# Patient Record
Sex: Female | Born: 2005 | Race: Black or African American | Hispanic: No | Marital: Single | Smoking: Former smoker
Health system: Southern US, Community
[De-identification: ages and names within clinical notes are randomized; demographics above are authoritative.]

---
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
## 2023-01-10 ENCOUNTER — Encounter: Payer: Medicaid Other | Admitting: Nurse Practitioner

## 2023-01-10 DIAGNOSIS — Z113 Encounter for screening for infections with a predominantly sexual mode of transmission: Secondary | ICD-10-CM | POA: Diagnosis not present

## 2023-01-11 ENCOUNTER — Encounter: Payer: Medicaid Other | Admitting: Nurse Practitioner

## 2023-04-24 ENCOUNTER — Telehealth: Payer: Self-pay

## 2023-04-24 NOTE — Telephone Encounter (Signed)
LVM for patient to call back [phone], or to call PCP office to schedule follow up apt. AS, CMA  

## 2023-05-27 DIAGNOSIS — K047 Periapical abscess without sinus: Secondary | ICD-10-CM | POA: Diagnosis not present

## 2024-03-07 DIAGNOSIS — X58XXXA Exposure to other specified factors, initial encounter: Secondary | ICD-10-CM | POA: Diagnosis not present

## 2024-03-07 DIAGNOSIS — S62662A Nondisplaced fracture of distal phalanx of right middle finger, initial encounter for closed fracture: Secondary | ICD-10-CM | POA: Diagnosis not present

## 2024-03-07 DIAGNOSIS — S63612A Unspecified sprain of right middle finger, initial encounter: Secondary | ICD-10-CM | POA: Diagnosis not present

## 2024-03-07 DIAGNOSIS — M79644 Pain in right finger(s): Secondary | ICD-10-CM | POA: Diagnosis not present

## 2024-04-06 ENCOUNTER — Emergency Department
Admission: EM | Admit: 2024-04-06 | Discharge: 2024-04-06 | Disposition: A | Discharge status: Home / Self Care | Attending: Emergency Medicine | Admitting: Emergency Medicine

## 2024-04-06 ENCOUNTER — Emergency Department

## 2024-04-06 ENCOUNTER — Other Ambulatory Visit: Payer: Self-pay

## 2024-04-06 DIAGNOSIS — R55 Syncope and collapse: Secondary | ICD-10-CM | POA: Insufficient documentation

## 2024-04-06 DIAGNOSIS — M791 Myalgia, unspecified site: Secondary | ICD-10-CM | POA: Diagnosis not present

## 2024-04-06 DIAGNOSIS — N3001 Acute cystitis with hematuria: Secondary | ICD-10-CM | POA: Diagnosis not present

## 2024-04-06 DIAGNOSIS — R42 Dizziness and giddiness: Secondary | ICD-10-CM | POA: Diagnosis not present

## 2024-04-06 LAB — CBC WITH DIFFERENTIAL/PLATELET
Abs Immature Granulocytes: 0.06 10*3/uL (ref 0.00–0.07)
Basophils Absolute: 0.1 10*3/uL (ref 0.0–0.1)
Basophils Relative: 1 %
Eosinophils Absolute: 0.1 10*3/uL (ref 0.0–0.5)
Eosinophils Relative: 1 %
HCT: 36.5 % (ref 36.0–46.0)
Hemoglobin: 12.2 g/dL (ref 12.0–15.0)
Immature Granulocytes: 0 %
Lymphocytes Relative: 16 %
Lymphs Abs: 2.2 10*3/uL (ref 0.7–4.0)
MCH: 28.8 pg (ref 26.0–34.0)
MCHC: 33.4 g/dL (ref 30.0–36.0)
MCV: 86.1 fL (ref 80.0–100.0)
Monocytes Absolute: 0.7 10*3/uL (ref 0.1–1.0)
Monocytes Relative: 5 %
Neutro Abs: 10.2 10*3/uL — ABNORMAL HIGH (ref 1.7–7.7)
Neutrophils Relative %: 77 %
Platelets: 230 10*3/uL (ref 150–400)
RBC: 4.24 MIL/uL (ref 3.87–5.11)
RDW: 13.2 % (ref 11.5–15.5)
WBC: 13.4 10*3/uL — ABNORMAL HIGH (ref 4.0–10.5)
nRBC: 0 % (ref 0.0–0.2)

## 2024-04-06 LAB — URINALYSIS, ROUTINE W REFLEX MICROSCOPIC
Bilirubin Urine: NEGATIVE
Glucose, UA: NEGATIVE mg/dL
Ketones, ur: 5 mg/dL — AB
Leukocytes,Ua: NEGATIVE
Nitrite: POSITIVE — AB
Protein, ur: NEGATIVE mg/dL
Specific Gravity, Urine: 1.024 (ref 1.005–1.030)
pH: 5 (ref 5.0–8.0)

## 2024-04-06 LAB — COMPREHENSIVE METABOLIC PANEL WITH GFR
ALT: 15 U/L (ref 0–44)
AST: 23 U/L (ref 15–41)
Albumin: 4.6 g/dL (ref 3.5–5.0)
Alkaline Phosphatase: 60 U/L (ref 38–126)
Anion gap: 2 — ABNORMAL LOW (ref 5–15)
BUN: 13 mg/dL (ref 6–20)
CO2: 31 mmol/L (ref 22–32)
Calcium: 9.2 mg/dL (ref 8.9–10.3)
Chloride: 106 mmol/L (ref 98–111)
Creatinine, Ser: 0.81 mg/dL (ref 0.44–1.00)
GFR, Estimated: >60 mL/min (ref >60)
Glucose, Bld: 144 mg/dL — ABNORMAL HIGH (ref 70–99)
Potassium: 3.1 mmol/L — ABNORMAL LOW (ref 3.5–5.1)
Sodium: 139 mmol/L (ref 135–145)
Total Bilirubin: 0.7 mg/dL (ref 0.0–1.2)
Total Protein: 7.5 g/dL (ref 6.5–8.1)

## 2024-04-06 LAB — TROPONIN I (HIGH SENSITIVITY): Troponin I (High Sensitivity): 7 ng/L (ref <18)

## 2024-04-06 LAB — POC URINE PREG, ED: Preg Test, Ur: NEGATIVE

## 2024-04-06 MED ORDER — CEPHALEXIN 500 MG PO CAPS
500.0000 mg | ORAL_CAPSULE | Freq: Three times a day (TID) | ORAL | 0 refills | Status: AC
Start: 2024-04-06 — End: 2024-04-09

## 2024-04-06 MED ORDER — NAPROXEN 500 MG PO TABS
500.0000 mg | ORAL_TABLET | Freq: Once | ORAL | Status: AC
  Administered 2024-04-06: 500 mg via ORAL
  Filled 2024-04-06: qty 1

## 2024-04-06 NOTE — ED Notes (Signed)
 Mother to nurses station reporting that pt is c/o of bilateral shoulder pain. Rn Odilia Bennett made aware, advised that she will message the NP and let her know. Mom reports pt asked for pain medication "an hour ago and got the same answer" and asked "is the doctor not in the building?" Mother was informed the NP is in the building.

## 2024-04-06 NOTE — ED Triage Notes (Signed)
 Pt brought in from work by EMS for lightheadedness and weakness that started today. Per pt, she passed out at work and hit her head. EMS made no mention of syncopal episode in their report. Pt complaining of chest pain, back pain, and shoulder pain; rates pain 10/10.

## 2024-04-09 NOTE — ED Provider Notes (Signed)
 [facility] Provider Note    Event Date/Time   First MD Initiated Contact with Patient 04/06/24 1135     (approximate)   History   Loss of Consciousness   HPI  Chelsea Galloway is a 18 y.o. female with no past medical history and as listed in EMR presents to the emergency department for treatment and evaluation after feeling lightheaded and weak today while at work.  She arrives via EMS and now reports that she had a syncopal episode that was witnessed by her Production designer, theatre/television/film.  Manager states that she was out for a few seconds but she did hit her head.  Patient complaining of headache, bilateral shoulder pain and chest pain.Aaron Aas      Physical Exam   Triage Vital Signs: ED Triage Vitals  Encounter Vitals Group     BP 04/06/24 1141 134/83     Systolic BP Percentile --      Diastolic BP Percentile --      Pulse Rate 04/06/24 1141 74     Resp 04/06/24 1141 20     Temp 04/06/24 1141 97.6 F (36.4 C)     Temp Source 04/06/24 1141 Oral     SpO2 04/06/24 1141 100 %     Weight 04/06/24 1142 100 lb (45.4 kg)     Height 04/06/24 1142 5\' 5"  (1.651 m)     Head Circumference --      Peak Flow --      Pain Score 04/06/24 1142 10     Pain Loc --      Pain Education --      Exclude from Growth Chart --     Most recent vital signs: Vitals:   04/06/24 1430 04/06/24 1500  BP: 123/69 118/71  Pulse: 71 71  Resp: (!) 23 15  Temp:    SpO2: 100% 100%    General: Awake, no distress.  CV:  Good peripheral perfusion.  Resp:  Normal effort.  Abd:  No distention.  Other:  No focal neuro abnormality. Pupils equal, round, and reactive to light. Full ROM bilateral shoulders. No focal tenderness along length of the spine. Ambulates without assistance and normal gait.   ED Results / Procedures / Treatments   Labs (all labs ordered are listed, but only abnormal results are displayed) Labs Reviewed  URINALYSIS, ROUTINE W REFLEX MICROSCOPIC - Abnormal; Notable for the following  components:      Result Value   Color, Urine AMBER (*)    APPearance TURBID (*)    Hgb urine dipstick MODERATE (*)    Ketones, ur 5 (*)    Nitrite POSITIVE (*)    Bacteria, UA MANY (*)    All other components within normal limits  CBC WITH DIFFERENTIAL/PLATELET - Abnormal; Notable for the following components:   WBC 13.4 (*)    Neutro Abs 10.2 (*)    All other components within normal limits  COMPREHENSIVE METABOLIC PANEL WITH GFR - Abnormal; Notable for the following components:   Potassium 3.1 (*)    Glucose, Bld 144 (*)    Anion gap 2 (*)    All other components within normal limits  POC URINE PREG, ED  TROPONIN I (HIGH SENSITIVITY)     EKG  NSR rate of 75   RADIOLOGY  Image and radiology report reviewed and interpreted by me. Radiology report consistent with the same.  CT head and cervical spine negative for acute concerns.  PROCEDURES:  Critical Care performed: No  Procedures   MEDICATIONS ORDERED IN ED:  Medications  naproxen  (NAPROSYN ) tablet 500 mg (500 mg Oral Given 04/06/24 1341)     IMPRESSION / MDM / ASSESSMENT AND PLAN / ED COURSE   I have reviewed the triage note.  Differential diagnosis includes, but is not limited to, syncope, hypoglycemia, cardiac event  Patient's presentation is most consistent with acute presentation with potential threat to life or bodily function.  18 year old female presenting to the emergency department after syncopal episode while at work today.  Exam is reassuring.  She has no focal neuro deficits.  She has general body pain without difficulty with range of motion. Plan will be to await labs and get CT imaging of her head and cervical spine.   EKG, CT head and cervical spine and labs are reassuring.  Urinalysis does show many bacteria, positive nitrates and moderate hemoglobin.  Patient discharged home with instruction to take medication as prescribed until finished.  She also reports that she had not eaten today  and was encouraged to eat before she goes to work.  She is to follow-up with primary care if symptoms become recurrent.      FINAL CLINICAL IMPRESSION(S) / ED DIAGNOSES   Final diagnoses:  Syncope, unspecified syncope type  Acute cystitis with hematuria     Rx / DC Orders   ED Discharge Orders          Ordered    cephALEXin  (KEFLEX ) 500 MG capsule  3 times daily        04/06/24 1458             Note:  This document was prepared using Dragon voice recognition software and may include unintentional dictation errors.   Sherryle Don, FNP 04/09/24 1445    Viviano Ground, MD 04/14/24 1028

## 2024-05-26 ENCOUNTER — Ambulatory Visit: Payer: Self-pay

## 2024-05-26 NOTE — Telephone Encounter (Signed)
 FYI Only or Action Required?: FYI only for provider.  Patient was last seen in primary care on 09/09/2021 by Melvin Pao, NP.  Called Nurse Triage reporting Loss of Consciousness and Nausea.  Symptoms began several days ago.  Interventions attempted: Rest, hydration, or home remedies.  Symptoms are: unchanged.  Triage Disposition: See PCP When Office is Open (Within 3 Days)  Patient/caregiver understands and will follow disposition?: Yes      Copied from CRM [id number]. Topic: Clinical - Red Word Triage >> May 26, 2024  9:02 AM Chelsea Galloway wrote: Red Word that prompted transfer to Nurse Triage: Passing out and experiencing nausea. First episode was not sure of date. EMT called but did not go to emergency room (Blood pressure or blood sugar was low enough to admitted in hospital). Second episode was on July 19th, 2025 felt herself about to pass out but she didn't, but had the same exact symptoms. Reason for Disposition  [1] All other patients AND [2] now alert and feels fine  (Exception: SIMPLE FAINT due to stress, pain, prolonged standing, or suddenly standing)  Answer Assessment - Initial Assessment Questions 1. ONSET: How long were you unconscious? (e.g., minutes, seconds) When did it happen?     Unknown, thinks a few minutes 2. CONTENT: What happened during the period of unconsciousness? (e.g., seizure activity)      Before events - Hot, loses vision and then passes out 3. MENTAL STATUS: Alert and oriented now? (e.g., oriented x 3 = name, month, location)      yes 4. TRIGGER: What do you think caused the fainting? What were you doing just before you fainted?  (e.g., exercise, sudden standing up, prolonged standing)     Endorses possible low blood sugar during 1st event 5. RECURRENT SYMPTOM: Have you ever passed out before? If Yes, ask: When was the last time? and What happened that time?      Yes, 1 event prior and was taken to ED: endorses had either low BP,  or low sugar and was dx with UTI (completed tx) 6. INJURY: Did you hurt yourself when you fell?      Hit back of head 7. CARDIAC SYMPTOMS: Have you had any of the following symptoms: chest pain, difficulty breathing, palpitations?     Heart was racing pretty  fast when it happened 8. NEUROLOGIC SYMPTOMS: Have you had any of the following symptoms: headache, numbness, vertigo, weakness?     Does endorse some weakness, denies other sx 9. GI SYMPTOMS: Have you had any of the following symptoms: abdomen pain, vomiting, diarrhea, blood in stools?     nausea 10. OTHER SYMPTOMS: Do you have any other symptoms?       denies 11. PREGNANCY: Is there any chance you are pregnant? When was your last menstrual period?       Denies LMP currently on cycle now    Scheduled patient on the next available acute appt on May 28, 2024 with PCP .  Protocols used: [employer]

## 2024-05-26 NOTE — Telephone Encounter (Signed)
 Noted

## 2024-05-28 ENCOUNTER — Ambulatory Visit: Attending: Nurse Practitioner

## 2024-05-28 ENCOUNTER — Encounter: Payer: Self-pay | Admitting: Nurse Practitioner

## 2024-05-28 ENCOUNTER — Ambulatory Visit (INDEPENDENT_AMBULATORY_CARE_PROVIDER_SITE_OTHER): Admitting: Nurse Practitioner

## 2024-05-28 VITALS — BP 100/64 | HR 93 | Temp 99.0°F | Ht 65.0 in | Wt 95.4 lb

## 2024-05-28 DIAGNOSIS — R55 Syncope and collapse: Secondary | ICD-10-CM | POA: Diagnosis not present

## 2024-05-28 DIAGNOSIS — R7309 Other abnormal glucose: Secondary | ICD-10-CM

## 2024-05-28 NOTE — Progress Notes (Signed)
 BP 100/64   Pulse 93   Temp 99 F (37.2 C) (Oral)   Ht 5' 5 (1.651 m)   Wt 95 lb 6.4 oz (43.3 kg)   LMP 05/26/2024 (Exact Date)   BMI 15.88 kg/m    Subjective:    Patient ID: Chelsea Galloway, female    DOB: 04-10-2006, 18 y.o.   MRN: [id number]  HPI: Chelsea Galloway is a 18 y.o. female  Chief Complaint  Patient presents with   Loss of Consciousness    Patient states she has passed out twice since 2024-04-24.    Patient states she was at work on Jun 19, 2025and she wasn't feeling well and then she passed out.  This happened another time on June 19.  She didn't pass out but she felt like she was going to.  She felt like her vision was blurry, she was sweating, breathing was faster.  States she calmed herself down and then EMS was called and they did vitals and her blood sugar.  She states she drinks mainly Gatorade.  Drinks a good amount of soda also.  Since those episodes she has been drinking more water.  States she does eat regularly.  She does smoke marijuana.  Doesn't drink alcohol on a regular basis.  No tobacco use.     Relevant past medical, surgical, family and social history reviewed and updated as indicated. Interim medical history since our last visit reviewed. Allergies and medications reviewed and updated.  Review of Systems  Neurological:  Positive for syncope.    Per HPI unless specifically indicated above     Objective:    BP 100/64   Pulse 93   Temp 99 F (37.2 C) (Oral)   Ht 5' 5 (1.651 m)   Wt 95 lb 6.4 oz (43.3 kg)   LMP 05/26/2024 (Exact Date)   BMI 15.88 kg/m   Wt Readings from Last 3 Encounters:  05/28/24 95 lb 6.4 oz (43.3 kg) (1%, Z= -2.25)*  2024-04-24 100 lb (45.4 kg) (3%, Z= -1.82)*  09/09/21 103 lb 12.8 oz (47.1 kg) (12%, Z= -1.18)*   * Growth percentiles are based on CDC (Girls, 2-20 Years) data.    Physical Exam Vitals and nursing note reviewed.  Constitutional:      General: She is not in acute distress.    Appearance: Normal appearance.  She is normal weight. She is not ill-appearing, toxic-appearing or diaphoretic.  HENT:     Head: Normocephalic.     Right Ear: External ear normal.     Left Ear: External ear normal.     Nose: Nose normal.     Mouth/Throat:     Mouth: Mucous membranes are moist.     Pharynx: Oropharynx is clear.  Eyes:     General:        Right eye: No discharge.        Left eye: No discharge.     Extraocular Movements: Extraocular movements intact.     Conjunctiva/sclera: Conjunctivae normal.     Pupils: Pupils are equal, round, and reactive to light.  Cardiovascular:     Rate and Rhythm: Normal rate and regular rhythm.     Heart sounds: No murmur heard. Pulmonary:     Effort: Pulmonary effort is normal. No respiratory distress.     Breath sounds: Normal breath sounds. No wheezing or rales.  Musculoskeletal:     Cervical back: Normal range of motion and neck supple.  Skin:    General: Skin  is warm and dry.     Capillary Refill: Capillary refill takes less than 2 seconds.  Neurological:     General: No focal deficit present.     Mental Status: She is alert and oriented to person, place, and time. Mental status is at baseline.  Psychiatric:        Mood and Affect: Mood normal.        Behavior: Behavior normal.        Thought Content: Thought content normal.        Judgment: Judgment normal.     Results for orders placed or performed during the hospital encounter of 04/06/24  Urinalysis, Routine w reflex microscopic -Urine, Clean Catch   Collection Time: 04/06/24 12:29 PM  Result Value Ref Range   Color, Urine AMBER (A) YELLOW   APPearance TURBID (A) CLEAR   Specific Gravity, Urine 1.024 1.005 - 1.030   pH 5.0 5.0 - 8.0   Glucose, UA NEGATIVE NEGATIVE mg/dL   Hgb urine dipstick MODERATE (A) NEGATIVE   Bilirubin Urine NEGATIVE NEGATIVE   Ketones, ur 5 (A) NEGATIVE mg/dL   Protein, ur NEGATIVE NEGATIVE mg/dL   Nitrite POSITIVE (A) NEGATIVE   Leukocytes,Ua NEGATIVE NEGATIVE   RBC / HPF  0-5 0 - 5 RBC/hpf   WBC, UA 0-5 0 - 5 WBC/hpf   Bacteria, UA MANY (A) NONE SEEN   Squamous Epithelial / HPF 0-5 0 - 5 /HPF   Mucus PRESENT   CBC with Differential   Collection Time: 04/06/24 12:29 PM  Result Value Ref Range   WBC 13.4 (H) 4.0 - 10.5 K/uL   RBC 4.24 3.87 - 5.11 MIL/uL   Hemoglobin 12.2 12.0 - 15.0 g/dL   HCT 63.4 63.9 - 53.9 %   MCV 86.1 80.0 - 100.0 fL   MCH 28.8 26.0 - 34.0 pg   MCHC 33.4 30.0 - 36.0 g/dL   RDW 86.7 88.4 - 84.4 %   Platelets 230 150 - 400 K/uL   nRBC 0.0 0.0 - 0.2 %   Neutrophils Relative % 77 %   Neutro Abs 10.2 (H) 1.7 - 7.7 K/uL   Lymphocytes Relative 16 %   Lymphs Abs 2.2 0.7 - 4.0 K/uL   Monocytes Relative 5 %   Monocytes Absolute 0.7 0.1 - 1.0 K/uL   Eosinophils Relative 1 %   Eosinophils Absolute 0.1 0.0 - 0.5 K/uL   Basophils Relative 1 %   Basophils Absolute 0.1 0.0 - 0.1 K/uL   Immature Granulocytes 0 %   Abs Immature Granulocytes 0.06 0.00 - 0.07 K/uL  Comprehensive metabolic panel   Collection Time: 04/06/24 12:29 PM  Result Value Ref Range   Sodium 139 135 - 145 mmol/L   Potassium 3.1 (L) 3.5 - 5.1 mmol/L   Chloride 106 98 - 111 mmol/L   CO2 31 22 - 32 mmol/L   Glucose, Bld 144 (H) 70 - 99 mg/dL   BUN 13 6 - 20 mg/dL   Creatinine, Ser 9.18 0.44 - 1.00 mg/dL   Calcium 9.2 8.9 - 89.6 mg/dL   Total Protein 7.5 6.5 - 8.1 g/dL   Albumin 4.6 3.5 - 5.0 g/dL   AST 23 15 - 41 U/L   ALT 15 0 - 44 U/L   Alkaline Phosphatase 60 38 - 126 U/L   Total Bilirubin 0.7 0.0 - 1.2 mg/dL   GFR, Estimated >39 >39 mL/min   Anion gap 2 (L) 5 - 15  Troponin I (High Sensitivity)  Collection Time: 04/06/24 12:29 PM  Result Value Ref Range   Troponin I (High Sensitivity) 7 <18 ng/L  POC Urine Pregnancy, ED   Collection Time: 04/06/24 12:43 PM  Result Value Ref Range   Preg Test, Ur NEGATIVE NEGATIVE      Assessment & Plan:   Problem List Items Addressed This Visit   None Visit Diagnoses       Syncope, unspecified syncope type    -   Primary   Suspect vasovagal symptoms. Will order ZIO monitor. EKG and Orthostatics in office unremarkable.  Labs ordered. Follow up in 6 weeks.   Relevant Orders   EKG 12-Lead   Anemia Profile B   Comp Met (CMET)   LONG TERM MONITOR (3-14 DAYS)     Elevated glucose       Labs ordered due to glucose in the ER.   Relevant Orders   HgB A1c        Follow up plan: Return in about 6 weeks (around 07/09/2024) for FU Syncope .

## 2024-05-29 ENCOUNTER — Ambulatory Visit: Payer: Self-pay | Admitting: Nurse Practitioner

## 2024-05-29 LAB — ANEMIA PROFILE B
Basophils Absolute: 0 x10E3/uL (ref 0.0–0.2)
Basos: 0 %
EOS (ABSOLUTE): 0.1 x10E3/uL (ref 0.0–0.4)
Eos: 1 %
Ferritin: 14 ng/mL — ABNORMAL LOW (ref 15–77)
Folate: 14.9 ng/mL (ref >3.0)
Hematocrit: 37.7 % (ref 34.0–46.6)
Hemoglobin: 12.2 g/dL (ref 11.1–15.9)
Immature Grans (Abs): 0 x10E3/uL (ref 0.0–0.1)
Immature Granulocytes: 0 %
Iron Saturation: 11 % — ABNORMAL LOW (ref 15–55)
Iron: 37 ug/dL (ref 27–159)
Lymphocytes Absolute: 2.2 x10E3/uL (ref 0.7–3.1)
Lymphs: 20 %
MCH: 28.9 pg (ref 26.6–33.0)
MCHC: 32.4 g/dL (ref 31.5–35.7)
MCV: 89 fL (ref 79–97)
Monocytes Absolute: 0.6 x10E3/uL (ref 0.1–0.9)
Monocytes: 5 %
Neutrophils Absolute: 7.9 x10E3/uL — ABNORMAL HIGH (ref 1.4–7.0)
Neutrophils: 74 %
Platelets: 194 x10E3/uL (ref 150–450)
RBC: 4.22 x10E6/uL (ref 3.77–5.28)
RDW: 13.2 % (ref 11.7–15.4)
Retic Ct Pct: 0.9 % (ref 0.6–2.6)
Total Iron Binding Capacity: 334 ug/dL (ref 250–450)
UIBC: 297 ug/dL (ref 131–425)
Vitamin B-12: 265 pg/mL (ref 232–1245)
WBC: 10.8 x10E3/uL (ref 3.4–10.8)

## 2024-05-29 LAB — COMPREHENSIVE METABOLIC PANEL WITH GFR
ALT: 10 IU/L (ref 0–32)
AST: 19 IU/L (ref 0–40)
Albumin: 4.7 g/dL (ref 4.0–5.0)
Alkaline Phosphatase: 74 IU/L (ref 42–106)
BUN/Creatinine Ratio: 13 (ref 9–23)
BUN: 9 mg/dL (ref 6–20)
Bilirubin Total: 0.2 mg/dL (ref 0.0–1.2)
CO2: 20 mmol/L (ref 20–29)
Calcium: 9.6 mg/dL (ref 8.7–10.2)
Chloride: 103 mmol/L (ref 96–106)
Creatinine, Ser: 0.7 mg/dL (ref 0.57–1.00)
Globulin, Total: 2.4 g/dL (ref 1.5–4.5)
Glucose: 80 mg/dL (ref 70–99)
Potassium: 3.8 mmol/L (ref 3.5–5.2)
Sodium: 139 mmol/L (ref 134–144)
Total Protein: 7.1 g/dL (ref 6.0–8.5)
eGFR: 128 mL/min/1.73 (ref >59)

## 2024-05-29 LAB — HEMOGLOBIN A1C
Est. average glucose Bld gHb Est-mCnc: 111 mg/dL
Hgb A1c MFr Bld: 5.5 % (ref 4.8–5.6)

## 2024-06-02 ENCOUNTER — Ambulatory Visit: Payer: Self-pay

## 2024-06-02 NOTE — Telephone Encounter (Signed)
 Patient needs an appt.

## 2024-06-02 NOTE — Telephone Encounter (Signed)
 FYI Only or Action Required?: FYI only for provider.  Patient was last seen in primary care on 05/28/2024 by Melvin Pao, NP.  Called Nurse Triage reporting Abdominal Pain.  Symptoms began today.  Interventions attempted: Nothing.  Symptoms are: stable.  Triage Disposition: Home Care  Patient/caregiver understands and will follow disposition?: Yes Copied from CRM [id number] [id number]. Topic: Clinical - Red Word Triage >> Jun 02, 2024 11:39 AM Delon HERO wrote: Red Word that prompted transfer to Nurse Triage: Patient is calling to report sharp abdominal pain and vomiting this morning, and hot and sweaty 10 minutes after waking up. Reason for Disposition  [1] MILD-MODERATE pain AND [2] constant and [3] present < 2 hours  Answer Assessment - Initial Assessment Questions 1. LOCATION: Where does it hurt?      Lower left abd pain and across bottom  2. RADIATION: Does the pain shoot anywhere else? (e.g., chest, back)     No  3. ONSET: When did the pain begin? (e.g., minutes, hours or days ago)      Started this morning after waking up  4. SUDDEN: Gradual or sudden onset?     Suddenly   5. PATTERN Does the pain come and go, or is it constant?     Constant, feels like its gone away but not all the way  6. SEVERITY: How bad is the pain?  (e.g., Scale 1-10; mild, moderate, or severe)     4/10 pain right now  7. RECURRENT SYMPTOM: Have you ever had this type of stomach pain before? If Yes, ask: When was the last time? and What happened that time?      No  8. CAUSE: What do you think is causing the stomach pain? (e.g., gallstones, recent abdominal surgery)     Unsure of cause  9. RELIEVING/AGGRAVATING FACTORS: What makes it better or worse? (e.g., antacids, bending or twisting motion, bowel movement)     Felt a little better after vomiting, laying down  10. OTHER SYMPTOMS: Do you have any other symptoms? (e.g., back pain, diarrhea, fever, urination pain,  vomiting)       Vomiting acid like content  11. PREGNANCY: Is there any chance you are pregnant? When was your last menstrual period?       Currently on cycle  Protocols used: Abdominal Pain - Female-A-AH

## 2024-06-02 NOTE — Telephone Encounter (Signed)
 Patient has appointment 06/11/24

## 2024-06-11 ENCOUNTER — Ambulatory Visit (INDEPENDENT_AMBULATORY_CARE_PROVIDER_SITE_OTHER): Admitting: Nurse Practitioner

## 2024-06-11 ENCOUNTER — Encounter: Admitting: Nurse Practitioner

## 2024-06-11 ENCOUNTER — Encounter: Payer: Self-pay | Admitting: Nurse Practitioner

## 2024-06-11 VITALS — BP 112/76 | HR 82 | Temp 97.9°F | Ht 65.0 in | Wt 98.2 lb

## 2024-06-11 DIAGNOSIS — N3001 Acute cystitis with hematuria: Secondary | ICD-10-CM | POA: Diagnosis not present

## 2024-06-11 DIAGNOSIS — R300 Dysuria: Secondary | ICD-10-CM

## 2024-06-11 DIAGNOSIS — R8290 Unspecified abnormal findings in urine: Secondary | ICD-10-CM | POA: Diagnosis not present

## 2024-06-11 LAB — URINALYSIS, ROUTINE W REFLEX MICROSCOPIC
Bilirubin, UA: NEGATIVE
Glucose, UA: NEGATIVE
Ketones, UA: NEGATIVE
Leukocytes,UA: NEGATIVE
Nitrite, UA: POSITIVE — AB
Protein,UA: NEGATIVE
Specific Gravity, UA: 1.025 (ref 1.005–1.030)
Urobilinogen, Ur: 0.2 mg/dL (ref 0.2–1.0)
pH, UA: 6 (ref 5.0–7.5)

## 2024-06-11 LAB — MICROSCOPIC EXAMINATION: WBC, UA: NONE SEEN /HPF (ref 0–5)

## 2024-06-11 LAB — WET PREP FOR TRICH, YEAST, CLUE
Clue Cell Exam: NEGATIVE
Trichomonas Exam: NEGATIVE
Yeast Exam: NEGATIVE

## 2024-06-11 MED ORDER — NITROFURANTOIN MONOHYD MACRO 100 MG PO CAPS
100.0000 mg | ORAL_CAPSULE | Freq: Two times a day (BID) | ORAL | 0 refills | Status: AC
Start: 2024-06-11 — End: ?

## 2024-06-11 NOTE — Progress Notes (Signed)
 BP 112/76   Pulse 82   Temp 97.9 F (36.6 C) (Oral)   Ht 5' 5 (1.651 m)   Wt 98 lb 3.2 oz (44.5 kg)   LMP 05/26/2024 (Exact Date)   BMI 16.34 kg/m    Subjective:    Patient ID: Chelsea Galloway, female    DOB: 2006/05/05, 18 y.o.   MRN: [id number]  HPI: Chelsea Galloway is a 18 y.o. female  Chief Complaint  Patient presents with   Vaginal Discharge   Dysuria   URINARY SYMPTOMS Patient states she is having an abnormal smell in her urine. Dysuria: no Urinary frequency: no Urgency: no Small volume voids: no Symptom severity: no Urinary incontinence: no Foul odor: yes Hematuria: yes- but has resolved Abdominal pain: no Back pain: no Suprapubic pain/pressure: no Flank pain: no Fever:  no Vomiting: no Relief with cranberry juice: no Relief with pyridium: no Status: better/worse/stable Previous urinary tract infection: no Recurrent urinary tract infection: no Treatments attempted: increasing fluids   Relevant past medical, surgical, family and social history reviewed and updated as indicated. Interim medical history since our last visit reviewed. Allergies and medications reviewed and updated.  Review of Systems  Constitutional:  Negative for fever.  Gastrointestinal:  Negative for abdominal pain and vomiting.  Genitourinary:  Positive for hematuria. Negative for decreased urine volume, dysuria, flank pain, frequency and urgency.       Foul odor in urine  Musculoskeletal:  Negative for back pain.    Per HPI unless specifically indicated above     Objective:    BP 112/76   Pulse 82   Temp 97.9 F (36.6 C) (Oral)   Ht 5' 5 (1.651 m)   Wt 98 lb 3.2 oz (44.5 kg)   LMP 05/26/2024 (Exact Date)   BMI 16.34 kg/m   Wt Readings from Last 3 Encounters:  06/11/24 98 lb 3.2 oz (44.5 kg)  05/28/24 95 lb 6.4 oz (43.3 kg) (1%, Z= -2.25)*  04/06/24 100 lb (45.4 kg) (3%, Z= -1.82)*   * Growth percentiles are based on CDC (Girls, 2-20 Years) data.    Physical  Exam Vitals and nursing note reviewed.  Constitutional:      General: She is not in acute distress.    Appearance: Normal appearance. She is normal weight. She is not ill-appearing, toxic-appearing or diaphoretic.  HENT:     Head: Normocephalic.     Right Ear: External ear normal.     Left Ear: External ear normal.     Nose: Nose normal.     Mouth/Throat:     Mouth: Mucous membranes are moist.     Pharynx: Oropharynx is clear.  Eyes:     General:        Right eye: No discharge.        Left eye: No discharge.     Extraocular Movements: Extraocular movements intact.     Conjunctiva/sclera: Conjunctivae normal.     Pupils: Pupils are equal, round, and reactive to light.  Cardiovascular:     Rate and Rhythm: Normal rate and regular rhythm.     Heart sounds: No murmur heard. Pulmonary:     Effort: Pulmonary effort is normal. No respiratory distress.     Breath sounds: Normal breath sounds. No wheezing or rales.  Abdominal:     General: Abdomen is flat. Bowel sounds are normal. There is no distension.     Palpations: Abdomen is soft. There is no mass.     Tenderness: There is  no abdominal tenderness. There is no right CVA tenderness, left CVA tenderness, guarding or rebound.     Hernia: No hernia is present.  Musculoskeletal:     Cervical back: Normal range of motion and neck supple.  Skin:    General: Skin is warm and dry.     Capillary Refill: Capillary refill takes less than 2 seconds.  Neurological:     General: No focal deficit present.     Mental Status: She is alert and oriented to person, place, and time. Mental status is at baseline.  Psychiatric:        Mood and Affect: Mood normal.        Behavior: Behavior normal.        Thought Content: Thought content normal.        Judgment: Judgment normal.     Results for orders placed or performed in visit on 05/28/24  Anemia Profile B   Collection Time: 05/28/24  3:24 PM  Result Value Ref Range   Total Iron  Binding  Capacity 334 250 - 450 ug/dL   UIBC 702 868 - 574 ug/dL   Iron  37 27 - 159 ug/dL   Iron  Saturation 11 (L) 15 - 55 %   Ferritin 14 (L) 15 - 77 ng/mL   Vitamin B-12 265 232 - 1,245 pg/mL   Folate 14.9 >3.0 ng/mL   WBC 10.8 3.4 - 10.8 x10E3/uL   RBC 4.22 3.77 - 5.28 x10E6/uL   Hemoglobin 12.2 11.1 - 15.9 g/dL   Hematocrit 62.2 65.9 - 46.6 %   MCV 89 79 - 97 fL   MCH 28.9 26.6 - 33.0 pg   MCHC 32.4 31.5 - 35.7 g/dL   RDW 86.7 88.2 - 84.5 %   Platelets 194 150 - 450 x10E3/uL   Neutrophils 74 Not Estab. %   Lymphs 20 Not Estab. %   Monocytes 5 Not Estab. %   Eos 1 Not Estab. %   Basos 0 Not Estab. %   Neutrophils Absolute 7.9 (H) 1.4 - 7.0 x10E3/uL   Lymphocytes Absolute 2.2 0.7 - 3.1 x10E3/uL   Monocytes Absolute 0.6 0.1 - 0.9 x10E3/uL   EOS (ABSOLUTE) 0.1 0.0 - 0.4 x10E3/uL   Basophils Absolute 0.0 0.0 - 0.2 x10E3/uL   Immature Granulocytes 0 Not Estab. %   Immature Grans (Abs) 0.0 0.0 - 0.1 x10E3/uL   Retic Ct Pct 0.9 0.6 - 2.6 %  Comp Met (CMET)   Collection Time: 05/28/24  3:24 PM  Result Value Ref Range   Glucose 80 70 - 99 mg/dL   BUN 9 6 - 20 mg/dL   Creatinine, Ser 9.29 0.57 - 1.00 mg/dL   eGFR 871 >40 fO/fpw/8.26   BUN/Creatinine Ratio 13 9 - 23   Sodium 139 134 - 144 mmol/L   Potassium 3.8 3.5 - 5.2 mmol/L   Chloride 103 96 - 106 mmol/L   CO2 20 20 - 29 mmol/L   Calcium 9.6 8.7 - 10.2 mg/dL   Total Protein 7.1 6.0 - 8.5 g/dL   Albumin 4.7 4.0 - 5.0 g/dL   Globulin, Total 2.4 1.5 - 4.5 g/dL   Bilirubin Total 0.2 0.0 - 1.2 mg/dL   Alkaline Phosphatase 74 42 - 106 IU/L   AST 19 0 - 40 IU/L   ALT 10 0 - 32 IU/L  HgB A1c   Collection Time: 05/28/24  3:24 PM  Result Value Ref Range   Hgb A1c MFr Bld 5.5 4.8 - 5.6 %  Est. average glucose Bld gHb Est-mCnc 111 mg/dL      Assessment & Plan:   Problem List Items Addressed This Visit   None Visit Diagnoses       Acute cystitis with hematuria    -  Primary   Will treat with macrobid .  Complete course of  medications.  Urine sent for culture.  Follow up if not improved.   Relevant Orders   Urine Culture     Bad odor of urine         Dysuria       Relevant Orders   Urinalysis, Routine w reflex microscopic   WET PREP FOR TRICH, YEAST, CLUE        Follow up plan: Return if symptoms worsen or fail to improve.

## 2024-06-12 ENCOUNTER — Ambulatory Visit: Payer: Self-pay | Admitting: Nurse Practitioner

## 2024-06-14 LAB — URINE CULTURE

## 2024-06-16 NOTE — Progress Notes (Signed)
 Contacted via MyChart   Macrobid  is susceptible to bacteria in urine, complete full course.

## 2024-06-27 DIAGNOSIS — R55 Syncope and collapse: Secondary | ICD-10-CM

## 2024-06-30 ENCOUNTER — Encounter: Admitting: Nurse Practitioner

## 2024-07-09 ENCOUNTER — Ambulatory Visit: Admitting: Nurse Practitioner

## 2024-07-09 NOTE — Progress Notes (Deleted)
 There were no vitals taken for this visit.   Subjective:    Patient ID: Chelsea Galloway, female    DOB: 07/01/2006, 18 y.o.   MRN: [id number]  HPI: Chelsea Galloway is a 18 y.o. female  No chief complaint on file.  Patient states she was at work on June 1 and she wasn't feeling well and then she passed out.  This happened another time on June 19.  She didn't pass out but she felt like she was going to.  She felt like her vision was blurry, she was sweating, breathing was faster.  States she calmed herself down and then EMS was called and they did vitals and her blood sugar.  She states she drinks mainly Gatorade.  Drinks a good amount of soda also.  Since those episodes she has been drinking more water.  States she does eat regularly.  She does smoke marijuana.  Doesn't drink alcohol on a regular basis.  No tobacco use.     Relevant past medical, surgical, family and social history reviewed and updated as indicated. Interim medical history since our last visit reviewed. Allergies and medications reviewed and updated.  Review of Systems  Neurological:  Positive for syncope.    Per HPI unless specifically indicated above     Objective:    There were no vitals taken for this visit.  Wt Readings from Last 3 Encounters:  06/11/24 98 lb 3.2 oz (44.5 kg)  05/28/24 95 lb 6.4 oz (43.3 kg) (1%, Z= -2.25)*  04/06/24 100 lb (45.4 kg) (3%, Z= -1.82)*   * Growth percentiles are based on CDC (Girls, 2-20 Years) data.    Physical Exam Vitals and nursing note reviewed.  Constitutional:      General: She is not in acute distress.    Appearance: Normal appearance. She is normal weight. She is not ill-appearing, toxic-appearing or diaphoretic.  HENT:     Head: Normocephalic.     Right Ear: External ear normal.     Left Ear: External ear normal.     Nose: Nose normal.     Mouth/Throat:     Mouth: Mucous membranes are moist.     Pharynx: Oropharynx is clear.  Eyes:     General:        Right  eye: No discharge.        Left eye: No discharge.     Extraocular Movements: Extraocular movements intact.     Conjunctiva/sclera: Conjunctivae normal.     Pupils: Pupils are equal, round, and reactive to light.  Cardiovascular:     Rate and Rhythm: Normal rate and regular rhythm.     Heart sounds: No murmur heard. Pulmonary:     Effort: Pulmonary effort is normal. No respiratory distress.     Breath sounds: Normal breath sounds. No wheezing or rales.  Musculoskeletal:     Cervical back: Normal range of motion and neck supple.  Skin:    General: Skin is warm and dry.     Capillary Refill: Capillary refill takes less than 2 seconds.  Neurological:     General: No focal deficit present.     Mental Status: She is alert and oriented to person, place, and time. Mental status is at baseline.  Psychiatric:        Mood and Affect: Mood normal.        Behavior: Behavior normal.        Thought Content: Thought content normal.  Judgment: Judgment normal.     Results for orders placed or performed in visit on 06/11/24  WET PREP FOR TRICH, YEAST, CLUE   Collection Time: 06/11/24  1:41 PM   Specimen: Urine   Urine  Result Value Ref Range   Trichomonas Exam Negative Negative   Yeast Exam Negative Negative   Clue Cell Exam Negative Negative  Microscopic Examination   Collection Time: 06/11/24  1:41 PM   Urine  Result Value Ref Range   WBC, UA None seen 0 - 5 /hpf   RBC, Urine 0-2 0 - 2 /hpf   Epithelial Cells (non renal) 0-10 0 - 10 /hpf   Bacteria, UA Many (A) None seen/Few  Urinalysis, Routine w reflex microscopic   Collection Time: 06/11/24  1:41 PM  Result Value Ref Range   Specific Gravity, UA 1.025 1.005 - 1.030   pH, UA 6.0 5.0 - 7.5   Color, UA Yellow Yellow   Appearance Ur Clear Clear   Leukocytes,UA Negative Negative   Protein,UA Negative Negative/Trace   Glucose, UA Negative Negative   Ketones, UA Negative Negative   RBC, UA 1+ (A) Negative   Bilirubin, UA  Negative Negative   Urobilinogen, Ur 0.2 0.2 - 1.0 mg/dL   Nitrite, UA Positive (A) Negative   Microscopic Examination See below:   Urine Culture   Collection Time: 06/11/24  2:19 PM   Specimen: Urine   UR  Result Value Ref Range   Urine Culture, Routine Final report (A)    Organism ID, Bacteria Comment (A)    Antimicrobial Susceptibility Comment       Assessment & Plan:   Problem List Items Addressed This Visit   None     Follow up plan: No follow-ups on file.

## 2024-07-11 ENCOUNTER — Ambulatory Visit: Admitting: Nurse Practitioner
